# Patient Record
Sex: Female | Born: 1990 | Race: Black or African American | Hispanic: No | Marital: Single | Smoking: Never smoker
Health system: Southern US, Community
[De-identification: ages and names within clinical notes are randomized; demographics above are authoritative.]

## PROBLEM LIST (undated history)

## (undated) DIAGNOSIS — I951 Orthostatic hypotension: Secondary | ICD-10-CM

## (undated) DIAGNOSIS — G90A Postural orthostatic tachycardia syndrome (POTS): Secondary | ICD-10-CM

## (undated) DIAGNOSIS — I498 Other specified cardiac arrhythmias: Secondary | ICD-10-CM

## (undated) DIAGNOSIS — R000 Tachycardia, unspecified: Secondary | ICD-10-CM

## (undated) HISTORY — PX: MOUTH SURGERY: SHX715

---

## 2015-05-27 ENCOUNTER — Emergency Department (HOSPITAL_COMMUNITY)
Admission: EM | Admit: 2015-05-27 | Discharge: 2015-05-28 | Disposition: A | Discharge status: Home / Self Care | Payer: BLUE CROSS/BLUE SHIELD | Attending: Emergency Medicine | Admitting: Emergency Medicine

## 2015-05-27 ENCOUNTER — Encounter (HOSPITAL_COMMUNITY): Payer: Self-pay | Admitting: Emergency Medicine

## 2015-05-27 DIAGNOSIS — Z8679 Personal history of other diseases of the circulatory system: Secondary | ICD-10-CM | POA: Insufficient documentation

## 2015-05-27 DIAGNOSIS — S0992XA Unspecified injury of nose, initial encounter: Secondary | ICD-10-CM | POA: Diagnosis not present

## 2015-05-27 DIAGNOSIS — S0083XA Contusion of other part of head, initial encounter: Secondary | ICD-10-CM

## 2015-05-27 DIAGNOSIS — Y9389 Activity, other specified: Secondary | ICD-10-CM | POA: Insufficient documentation

## 2015-05-27 DIAGNOSIS — Y929 Unspecified place or not applicable: Secondary | ICD-10-CM | POA: Insufficient documentation

## 2015-05-27 DIAGNOSIS — W228XXA Striking against or struck by other objects, initial encounter: Secondary | ICD-10-CM | POA: Diagnosis not present

## 2015-05-27 DIAGNOSIS — S0011XA Contusion of right eyelid and periocular area, initial encounter: Secondary | ICD-10-CM | POA: Diagnosis not present

## 2015-05-27 DIAGNOSIS — Y998 Other external cause status: Secondary | ICD-10-CM | POA: Insufficient documentation

## 2015-05-27 DIAGNOSIS — S0993XA Unspecified injury of face, initial encounter: Secondary | ICD-10-CM | POA: Diagnosis present

## 2015-05-27 HISTORY — DX: Tachycardia, unspecified: R00.0

## 2015-05-27 HISTORY — DX: Postural orthostatic tachycardia syndrome (POTS): G90.A

## 2015-05-27 HISTORY — DX: Orthostatic hypotension: I95.1

## 2015-05-27 HISTORY — DX: Other specified cardiac arrhythmias: I49.8

## 2015-05-27 NOTE — ED Notes (Signed)
Pt. accidentally hit her face against the dresser this evening , reports pain at right side of face , no LOC / alert and oriented , respirations unlabored /ambulatory.

## 2015-05-27 NOTE — ED Provider Notes (Signed)
CSN: [id number]     Arrival date & time 05/27/15  2210 History  This chart was scribed for non-physician practitioner, Danelle Berry PA-C, working with Margarita Grizzle, MD by Bethel Born, ED Scribe. This patient was seen in room TR08C/TR08C and the patient's care was started at 11:14 PM.    Chief Complaint  Patient presents with  . Facial Pain   The history is provided by the patient. No language interpreter was used.   Angela Weiss is a 24 y.o. female who presents to the Emergency Department complaining of right sided facial pain with sudden onset just PTA after hitting her face on a dresser. The pt reports that she was bending at the waist and hit her face hard enough for her nose ring to fly out. The pain is constant and described as pressure, throbbing, and sharp. The pain extends from below her right eye, along her nose and right cheek.  There was no laceration, bruising or visible contusion or swelling. The pain is rated 8/10 in severity at worst and she took nothing for pain PTA. Associated symptoms include pain with movement of the eyes and a brief episode of blurred vision. Pt denies LOC, epistaxis, numbness, tingling, and current blurred vision.   Past Medical History  Diagnosis Date  . POTS (postural orthostatic tachycardia syndrome)    Past Surgical History  Procedure Laterality Date  . Mouth surgery     No family history on file. History  Substance Use Topics  . Smoking status: Never Smoker   . Smokeless tobacco: Not on file  . Alcohol Use: No   OB History    No data available     Review of Systems 10 Systems reviewed and are negative for acute change except as noted in the HPI.      Allergies  Review of patient's allergies indicates no known allergies.  Home Medications   Prior to Admission medications   Not on File   Triage Vitals: BP 134/76 mmHg  Pulse 118  Temp(Src) 98.7 F (37.1 C) (Oral)  Resp 14  Ht  (1.651 m)  Wt 120 lb (54.432 kg)  BMI  19.97 kg/m2  SpO2 100%  LMP 05/12/2015 Physical Exam  Constitutional: She is oriented to person, place, and time. She appears well-developed and well-nourished. No distress.  HENT:  Head: Normocephalic and atraumatic.  Right Ear: External ear normal.  Left Ear: External ear normal.  Nose: Right sinus exhibits maxillary sinus tenderness.  Mouth/Throat: Oropharynx is clear and moist.  Eyes: Conjunctivae, EOM and lids are normal. Pupils are equal, round, and reactive to light. Right eye exhibits no chemosis, no discharge, no exudate and no hordeolum. No foreign body present in the right eye. Left eye exhibits no chemosis, no discharge, no exudate and no hordeolum. No foreign body present in the left eye. Right conjunctiva is not injected. Right conjunctiva has no hemorrhage. Left conjunctiva is not injected. Left conjunctiva has no hemorrhage. No scleral icterus. Right eye exhibits normal extraocular motion and no nystagmus. Left eye exhibits normal extraocular motion and no nystagmus.  EOM normal bilaterally, but EOM painful in right eye with abduction and  No periorbital ecchymosis, erythema, or swelling No scleral blood   Neck: Normal range of motion. Neck supple. No tracheal deviation present.  Cardiovascular: Normal rate.   Pulmonary/Chest: Effort normal. No respiratory distress.  Musculoskeletal: Normal range of motion. She exhibits no edema or tenderness.  Neurological: She is alert and oriented to person, place, and  time.  Skin: Skin is warm and dry. No rash noted. She is not diaphoretic. No erythema. No pallor.  Psychiatric: She has a normal mood and affect. Her behavior is normal.  Nursing note and vitals reviewed.   ED Course  Procedures   DIAGNOSTIC STUDIES: Oxygen Saturation is 100% on RA, normal by my interpretation.    COORDINATION OF CARE: 11:18 PM Discussed treatment plan which includes CT maxillofacial without constrast with pt at bedside and pt agreed to plan. Pt  declines pain medication at initial interview.   Labs Review Labs Reviewed - No data to display  Imaging Review No results found.   EKG Interpretation None      MDM   Final diagnoses:  None   Pt with facial trauma - no visible trauma, bruising, redness or laceration, but physical exam reveals tender right maxillary sinus with painful EOM of the right eye with abduction and extorsion (looking laterally, and superior-laterally with right eye), pt declining any pain medicine, but concerned with pressure in her face - decided CT maxillofacial scan would be best to investigate any maxillary sinus or orbital floor fx  Ct read pending 1:22 AM Danelle BerryLeisa Nattaly Yebra, PA-C  CT shows no acute pathology or fracture. PT informed of results and discharged home.  I personally performed the services described in this documentation, which was scribed in my presence. The recorded information has been reviewed and is accurate.      Danelle BerryLeisa Subhan Hoopes, PA-C 06/02/15 2215  Margarita Grizzleanielle Ray, MD 06/03/15 [phone]

## 2015-05-28 ENCOUNTER — Emergency Department (HOSPITAL_COMMUNITY): Payer: BLUE CROSS/BLUE SHIELD

## 2015-05-28 NOTE — Discharge Instructions (Signed)
Contusion A contusion is a deep bruise. Contusions happen when an injury causes bleeding under the skin. Signs of bruising include pain, puffiness (swelling), and discolored skin. The contusion may turn blue, purple, or yellow. HOME CARE   Put ice on the injured area.  Put ice in a plastic bag.  Place a towel between your skin and the bag.  Leave the ice on for 15-20 minutes, 03-04 times a day.  Only take medicine as told by your doctor.  Rest the injured area.  If possible, raise (elevate) the injured area to lessen puffiness. GET HELP RIGHT AWAY IF:   You have more bruising or puffiness.  You have pain that is getting worse.  Your puffiness or pain is not helped by medicine.  Facial or Scalp Contusion A facial or scalp contusion is a deep bruise on the face or head. Injuries to the face and head generally cause a lot of swelling, especially around the eyes. Contusions are the result of an injury that caused bleeding under the skin. The contusion may turn blue, purple, or yellow. Minor injuries will give you a painless contusion, but more severe contusions may stay painful and swollen for a few weeks.  CAUSES  A facial or scalp contusion is caused by a blunt injury or trauma to the face or head area.  SIGNS AND SYMPTOMS   Swelling of the injured area.   Discoloration of the injured area.   Tenderness, soreness, or pain in the injured area.  DIAGNOSIS  The diagnosis can be made by taking a medical history and doing a physical exam. An X-ray exam, CT scan, or MRI may be needed to determine if there are any associated injuries, such as broken bones (fractures). TREATMENT  Often, the best treatment for a facial or scalp contusion is applying cold compresses to the injured area. Over-the-counter medicines may also be recommended for pain control.  HOME CARE INSTRUCTIONS   Only take over-the-counter or prescription medicines as directed by your health care provider.   Apply  ice to the injured area.   Put ice in a plastic bag.   Place a towel between your skin and the bag.   Leave the ice on for 20 minutes, 2-3 times a day.  SEEK MEDICAL CARE IF:  You have bite problems.   You have pain with chewing.   You are concerned about facial defects. SEEK IMMEDIATE MEDICAL CARE IF:  You have severe pain or a headache that is not relieved by medicine.   You have unusual sleepiness, confusion, or personality changes.   You throw up (vomit).   You have a persistent nosebleed.   You have double vision or blurred vision.   You have fluid drainage from your nose or ear.   You have difficulty walking or using your arms or legs.  MAKE SURE YOU:   Understand these instructions.  Will watch your condition.  Will get help right away if you are not doing well or get worse. Document Released: 12/17/2004 Document Revised: 08/30/2013 Document Reviewed: 06/22/2013 [facility] Patient Information 2015 [place], [place]. This information is not intended to replace advice given to you by your health care provider. Make sure you discuss any questions you have with your health care provider.   Understand these instructions.  Will watch your condition.  Will get help right away if you are not doing well or get worse. Document Released: 04/27/2008 Document Revised: 02/01/2012 Document Reviewed: 09/14/2011 [facility] Patient Information 2015 [place], [place]. This  information is not intended to replace advice given to you by your health care provider. Make sure you discuss any questions you have with your health care provider. ° °

## 2015-05-28 NOTE — ED Notes (Signed)
Patient transported to CT 

## 2015-05-28 NOTE — ED Notes (Signed)
Pt verbalizes understanding of d/c instructions and denies any further needs at this time. 

## 2016-02-23 IMAGING — CT CT MAXILLOFACIAL W/O CM
3 of 5 series · 7 of 47 positions shown, 8 images · non-contrast
Comparison: None.

CLINICAL DATA: Hit corner of shelf with right side of the nose.
Pain and pressure down the right side of the face. Initial
encounter.

EXAM:
CT MAXILLOFACIAL WITHOUT CONTRAST
TECHNIQUE: Multidetector CT imaging of the maxillofacial structures was
performed. Multiplanar CT image reconstructions were also generated.
A small metallic BB was placed on the right temple in order to
reliably differentiate right from left.

[Series 205: coronals · coronal · 0.31mm/px · 3 of 57 slices shown, 4 images]
[im 15/57  brain]
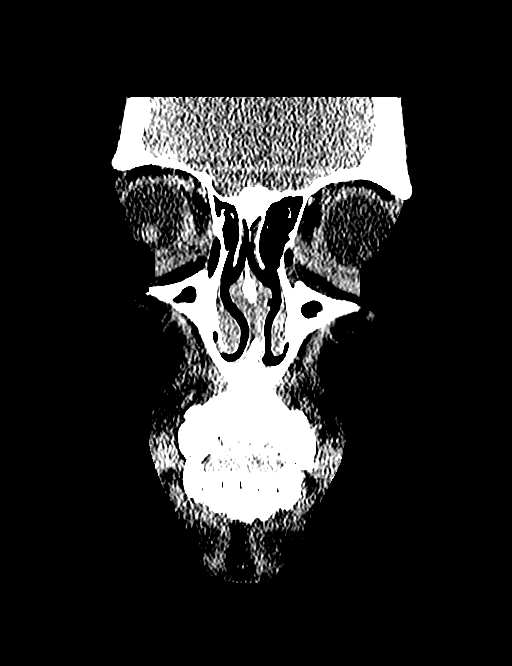
[im 15/57  bone]
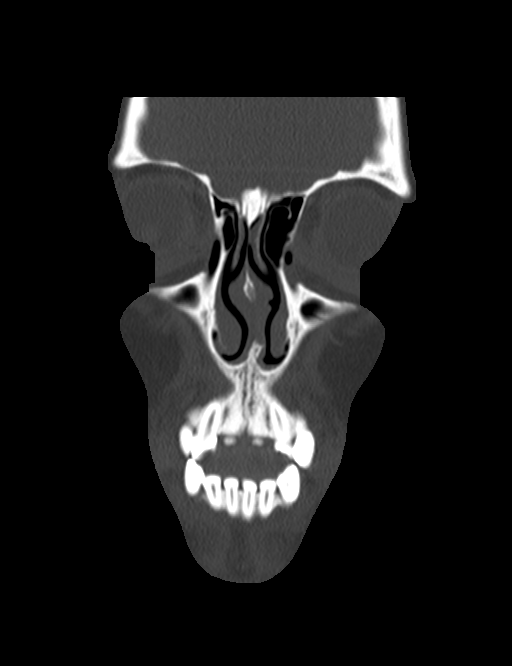
[im 29/57  bone]
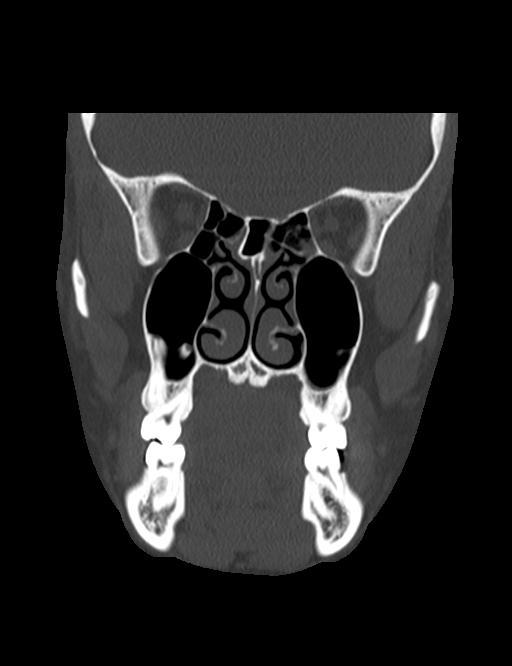
[im 43/57  bone]
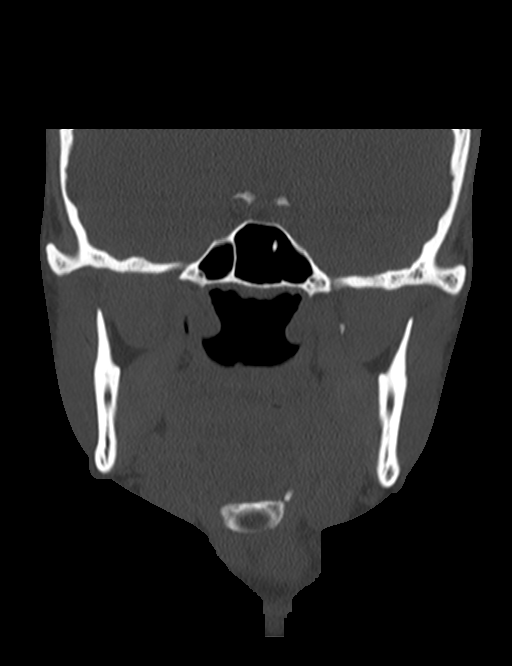

[Series 206: sagittals · sagittal · 0.31mm/px · 1 of 73 slices shown]
[im 37/73  bone]
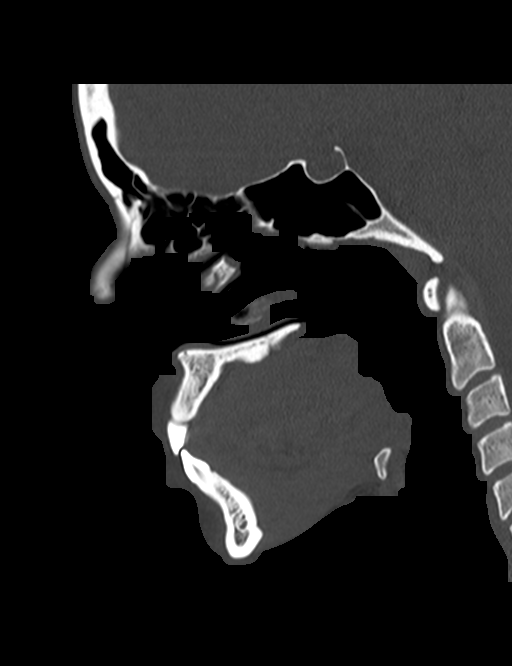

[Series 208: coronals soft tissue · coronal · 0.31mm/px · 3 of 62 slices shown]
[im 16/62  bone]
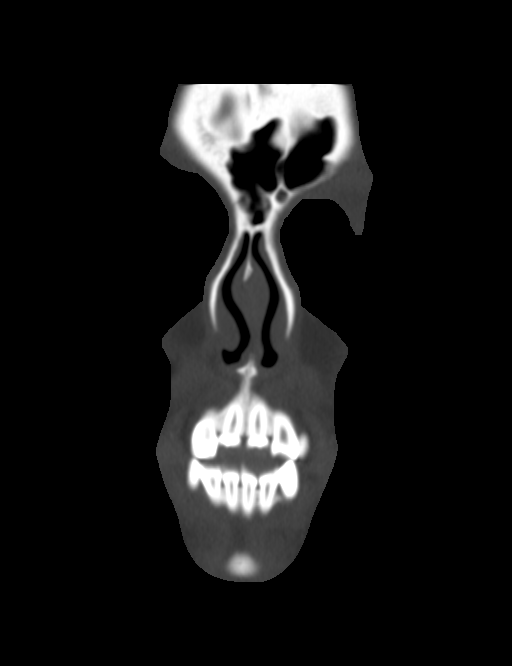
[im 31/62  bone]
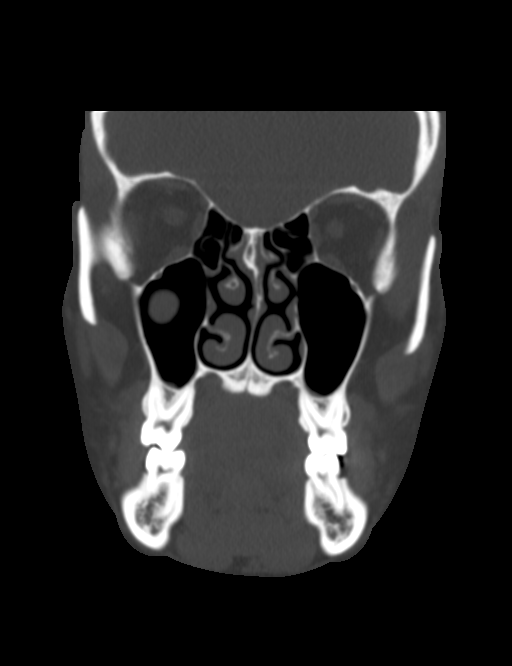
[im 46/62  bone]
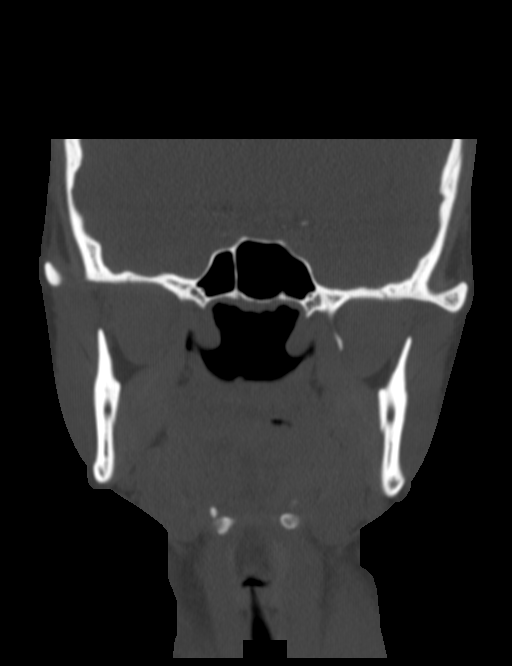

[7 of 47 positions shown; findings below may reference images not displayed]

FINDINGS: There is no evidence of fracture or dislocation. The maxilla and
mandible appear intact. The nasal bone is unremarkable in
appearance. The visualized dentition demonstrates no acute
abnormality.

The orbits are intact bilaterally. A mucus retention cyst or polyp
is noted within the right maxillary sinus. The remaining visualized
paranasal sinuses and mastoid air cells are well-aerated.

There is minimal right-sided nasal swelling. The parapharyngeal fat
planes are preserved. The nasopharynx, oropharynx and hypopharynx
are unremarkable in appearance. The visualized portions of the
valleculae and piriform sinuses are grossly unremarkable.

The parotid and submandibular glands are within normal limits. No
cervical lymphadenopathy is seen. The visualized portions of the
brain are unremarkable in appearance.
IMPRESSION: 1. No evidence of fracture or dislocation with regard to the
maxillofacial structures.
2. Minimal soft tissue swelling at the right side of the nose.
3. Mucus retention cyst or polyp within the right maxillary sinus.
